# Patient Record
Sex: Male | Born: 1975 | Race: Black or African American | Hispanic: No | Marital: Married | Smoking: Never smoker
Health system: Southern US, Community
[De-identification: ages and names within clinical notes are randomized; demographics above are authoritative.]

## PROBLEM LIST (undated history)

## (undated) DIAGNOSIS — J45909 Unspecified asthma, uncomplicated: Secondary | ICD-10-CM

## (undated) DIAGNOSIS — G43909 Migraine, unspecified, not intractable, without status migrainosus: Secondary | ICD-10-CM

## (undated) DIAGNOSIS — K219 Gastro-esophageal reflux disease without esophagitis: Secondary | ICD-10-CM

## (undated) HISTORY — PX: OTHER SURGICAL HISTORY: SHX169

## (undated) HISTORY — PX: ANTERIOR CRUCIATE LIGAMENT REPAIR: SHX115

---

## 2018-05-01 ENCOUNTER — Emergency Department (HOSPITAL_COMMUNITY)
Admission: EM | Admit: 2018-05-01 | Discharge: 2018-05-01 | Disposition: A | Discharge status: Home / Self Care | Payer: BC Managed Care – PPO | Attending: Emergency Medicine | Admitting: Emergency Medicine

## 2018-05-01 ENCOUNTER — Encounter (HOSPITAL_COMMUNITY): Payer: Self-pay

## 2018-05-01 ENCOUNTER — Emergency Department (HOSPITAL_COMMUNITY): Payer: BC Managed Care – PPO

## 2018-05-01 ENCOUNTER — Other Ambulatory Visit: Payer: Self-pay

## 2018-05-01 DIAGNOSIS — G43009 Migraine without aura, not intractable, without status migrainosus: Secondary | ICD-10-CM | POA: Insufficient documentation

## 2018-05-01 DIAGNOSIS — J45909 Unspecified asthma, uncomplicated: Secondary | ICD-10-CM | POA: Diagnosis not present

## 2018-05-01 DIAGNOSIS — R51 Headache: Secondary | ICD-10-CM | POA: Diagnosis present

## 2018-05-01 HISTORY — DX: Unspecified asthma, uncomplicated: J45.909

## 2018-05-01 HISTORY — DX: Gastro-esophageal reflux disease without esophagitis: K21.9

## 2018-05-01 HISTORY — DX: Migraine, unspecified, not intractable, without status migrainosus: G43.909

## 2018-05-01 LAB — HEPATIC FUNCTION PANEL
ALT: 29 U/L (ref 0–44)
AST: 22 U/L (ref 15–41)
Albumin: 3.6 g/dL (ref 3.5–5.0)
Alkaline Phosphatase: 67 U/L (ref 38–126)
Bilirubin, Direct: 0.2 mg/dL (ref 0.0–0.2)
Indirect Bilirubin: 0.4 mg/dL (ref 0.3–0.9)
TOTAL PROTEIN: 7 g/dL (ref 6.5–8.1)
Total Bilirubin: 0.6 mg/dL (ref 0.3–1.2)

## 2018-05-01 LAB — CBC
HEMATOCRIT: 39.4 % (ref 39.0–52.0)
HEMOGLOBIN: 12.7 g/dL — AB (ref 13.0–17.0)
MCH: 26.2 pg (ref 26.0–34.0)
MCHC: 32.2 g/dL (ref 30.0–36.0)
MCV: 81.4 fL (ref 78.0–100.0)
Platelets: 330 10*3/uL (ref 150–400)
RBC: 4.84 MIL/uL (ref 4.22–5.81)
RDW: 12.9 % (ref 11.5–15.5)
WBC: 4.3 10*3/uL (ref 4.0–10.5)

## 2018-05-01 LAB — URINALYSIS, ROUTINE W REFLEX MICROSCOPIC
Bilirubin Urine: NEGATIVE
GLUCOSE, UA: NEGATIVE mg/dL
HGB URINE DIPSTICK: NEGATIVE
KETONES UR: NEGATIVE mg/dL
Leukocytes, UA: NEGATIVE
Nitrite: NEGATIVE
PH: 6 (ref 5.0–8.0)
Protein, ur: NEGATIVE mg/dL
Specific Gravity, Urine: 1.009 (ref 1.005–1.030)

## 2018-05-01 LAB — DIFFERENTIAL
Abs Immature Granulocytes: 0 10*3/uL (ref 0.0–0.1)
Basophils Absolute: 0.1 10*3/uL (ref 0.0–0.1)
Basophils Relative: 2 %
Eosinophils Absolute: 0.3 10*3/uL (ref 0.0–0.7)
Eosinophils Relative: 7 %
Immature Granulocytes: 1 %
LYMPHS PCT: 28 %
Lymphs Abs: 1.2 10*3/uL (ref 0.7–4.0)
Monocytes Absolute: 0.3 10*3/uL (ref 0.1–1.0)
Monocytes Relative: 7 %
NEUTROS PCT: 55 %
Neutro Abs: 2.3 10*3/uL (ref 1.7–7.7)

## 2018-05-01 LAB — RAPID URINE DRUG SCREEN, HOSP PERFORMED
Amphetamines: NOT DETECTED
BENZODIAZEPINES: NOT DETECTED
COCAINE: NOT DETECTED
OPIATES: NOT DETECTED
TETRAHYDROCANNABINOL: NOT DETECTED

## 2018-05-01 LAB — APTT: APTT: 27 s (ref 24–36)

## 2018-05-01 LAB — I-STAT CHEM 8, ED
BUN: 9 mg/dL (ref 6–20)
CHLORIDE: 102 mmol/L (ref 98–111)
Calcium, Ion: 1.18 mmol/L (ref 1.15–1.40)
Creatinine, Ser: 1.1 mg/dL (ref 0.61–1.24)
Glucose, Bld: 103 mg/dL — ABNORMAL HIGH (ref 70–99)
HCT: 37 % — ABNORMAL LOW (ref 39.0–52.0)
Hemoglobin: 12.6 g/dL — ABNORMAL LOW (ref 13.0–17.0)
POTASSIUM: 3.9 mmol/L (ref 3.5–5.1)
SODIUM: 140 mmol/L (ref 135–145)
TCO2: 26 mmol/L (ref 22–32)

## 2018-05-01 LAB — BASIC METABOLIC PANEL
Anion gap: 7 (ref 5–15)
BUN: 10 mg/dL (ref 6–20)
CO2: 25 mmol/L (ref 22–32)
Calcium: 8.9 mg/dL (ref 8.9–10.3)
Chloride: 106 mmol/L (ref 98–111)
Creatinine, Ser: 1.12 mg/dL (ref 0.61–1.24)
GFR calc Af Amer: >60 mL/min (ref >60)
GLUCOSE: 103 mg/dL — AB (ref 70–99)
POTASSIUM: 3.7 mmol/L (ref 3.5–5.1)
SODIUM: 138 mmol/L (ref 135–145)

## 2018-05-01 LAB — ETHANOL

## 2018-05-01 LAB — I-STAT TROPONIN, ED: Troponin i, poc: 0 ng/mL (ref 0.00–0.08)

## 2018-05-01 LAB — D-DIMER, QUANTITATIVE (NOT AT ARMC)

## 2018-05-01 LAB — PROTIME-INR
INR: 1.02
PROTHROMBIN TIME: 13.3 s (ref 11.4–15.2)

## 2018-05-01 MED ORDER — DIPHENHYDRAMINE HCL 50 MG/ML IJ SOLN
12.5000 mg | Freq: Once | INTRAMUSCULAR | Status: AC
Start: 2018-05-01 — End: 2018-05-01
  Administered 2018-05-01: 12.5 mg via INTRAVENOUS
  Filled 2018-05-01: qty 1

## 2018-05-01 MED ORDER — SODIUM CHLORIDE 0.9 % IV BOLUS
1000.0000 mL | Freq: Once | INTRAVENOUS | Status: AC
  Administered 2018-05-01: 1000 mL via INTRAVENOUS

## 2018-05-01 MED ORDER — ACETAMINOPHEN 500 MG PO TABS
1000.0000 mg | ORAL_TABLET | Freq: Once | ORAL | Status: AC
  Administered 2018-05-01: 1000 mg via ORAL
  Filled 2018-05-01: qty 2

## 2018-05-01 MED ORDER — KETOROLAC TROMETHAMINE 30 MG/ML IJ SOLN
15.0000 mg | Freq: Once | INTRAMUSCULAR | Status: AC
  Administered 2018-05-01: 15 mg via INTRAVENOUS
  Filled 2018-05-01: qty 1

## 2018-05-01 MED ORDER — METOCLOPRAMIDE HCL 5 MG/ML IJ SOLN
10.0000 mg | Freq: Once | INTRAMUSCULAR | Status: AC
  Administered 2018-05-01: 10 mg via INTRAVENOUS
  Filled 2018-05-01: qty 2

## 2018-05-01 NOTE — ED Triage Notes (Signed)
Pt since 8pm last night has not been feeling good. Pt has been having a headache since then. Last night while driving home he had became confused that lasted for 15-20 seconds, also became  Nauseous and acid reflux began to flare up. Pt states he feels better but still has a headache. Pt also states that he is having neck pain that radiates to the chest. 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 Pt axox4 at this time.

## 2018-05-01 NOTE — ED Notes (Signed)
Walked patient to the bathroom patient did well 

## 2018-05-01 NOTE — Discharge Instructions (Addendum)
Please schedule an appointment with your neurologist to discuss your ED visit, MRI and symptoms today.  Please follow-up with your primary care doctor to evaluate what we call modifiable cardiovascular risk factors such as lifestyle, cholesterol, and others.  If your symptoms recur, or you have any new symptoms or concerns please seek additional medical care and evaluation.  As we discussed Your testing today came back reassuring and it is reassuring that your symptoms resolved with treatment.

## 2018-05-01 NOTE — ED Notes (Signed)
Care assumed at this time; resting quietly on stretcher with eyes closed; easily arousable; reports left-sdied h/a 7/7; meds ordered per MD; family at bedside asleep in recliner

## 2018-05-01 NOTE — ED Notes (Signed)
Transported to MRI via stretcher per rad tech  

## 2018-05-01 NOTE — ED Provider Notes (Signed)
[facility] Provider Note   CSN: [id number] Arrival date & time: 05/01/18  0532     History   Chief Complaint Chief Complaint  Patient presents with  . Transient Ischemic Attack  . Migraine  . Chest Pain    HPI Luke Willis is a 42 y.o. male with a past medical history of complex migraines, not currently taking preventive medications,  who presents today for evaluation of multiple symptoms.  He reports that since about 8 PM last night he has been in early not feeling well. He reports that he and his daughter are visiting from [place].  He reports that he was driving and started not feeling well.  He reports feeling generally weak.  He reports that he tried drinking a bunch of water which did not help.  He has a history of left sided symptoms with complex migraines.  He reports that his left sided head hurts.  He denies any trauma.    He reports that he had 2 substances started, one last night, and one early this morning where he got to an intersection and became very confused.  He was unable to figure out where he needs to go.  His daughter reports that his speech was not slurred and he was otherwise acting normal during this.  He had asked his daughter to list ingredients on the Gatorade bottle to try and distract him in case it was anxiety and he fully remembers this interaction.    He denies any medications.  No history of high blood pressure blood clots.  HPI  Past Medical History:  Diagnosis Date  . Asthma    sports induce  . GERD (gastroesophageal reflux disease)   . Migraines     There are no active problems to display for this patient.   Past Surgical History:  Procedure Laterality Date  . ANTERIOR CRUCIATE LIGAMENT REPAIR    . Left rotator cuff          Home Medications    Prior to Admission medications   Not on File    Family History History reviewed. No pertinent family history.  Social History Social  History   Tobacco Use  . Smoking status: Never Smoker  . Smokeless tobacco: Never Used  Substance Use Topics  . Alcohol use: Never    Frequency: Never  . Drug use: Never     Allergies   Hydrocodone-acetaminophen and Sertraline   Review of Systems Review of Systems  Constitutional: Negative for chills and fever.  HENT: Negative for ear pain and sore throat.   Eyes: Positive for visual disturbance (Blurry vision). Negative for pain.  Respiratory: Positive for chest tightness. Negative for cough and shortness of breath.   Cardiovascular: Positive for chest pain. Negative for palpitations and leg swelling.  Gastrointestinal: Positive for nausea. Negative for abdominal pain and vomiting.  Genitourinary: Negative for dysuria and hematuria.  Musculoskeletal: Negative for arthralgias and back pain.  Skin: Negative for color change and rash.  Neurological: Positive for weakness and headaches. Negative for seizures and syncope.  Psychiatric/Behavioral: Positive for confusion. The patient is not nervous/anxious.   All other systems reviewed and are negative.    Physical Exam Updated Vital Signs BP (!) 163/108   Pulse 63   Temp 97.6 F (36.4 C) (Oral)   Resp 20   Ht 6\' 5"  (1.956 m)   Wt (!) 148.8 kg (328 lb)   SpO2 100%   BMI 38.90 kg/m   Physical  Exam  Constitutional: He appears well-developed and well-nourished.  Non-toxic appearance. No distress.  HENT:  Head: Normocephalic and atraumatic.  Eyes: Pupils are equal, round, and reactive to light. Conjunctivae and EOM are normal.  Neck: Neck supple.  Cardiovascular: Normal rate, regular rhythm, intact distal pulses and normal pulses.  No murmur heard. Pulses:      Dorsalis pedis pulses are 2+ on the right side, and 2+ on the left side.       Posterior tibial pulses are 2+ on the right side, and 2+ on the left side.  Pulmonary/Chest: Effort normal and breath sounds normal. No respiratory distress. He has no decreased breath  sounds. He has no wheezes. He has no rhonchi. He has no rales.  Abdominal: Soft. There is no tenderness.  Musculoskeletal: He exhibits no edema.       Right lower leg: Normal. He exhibits no tenderness and no edema.       Left lower leg: Normal. He exhibits no tenderness and no edema.  Neurological: He is alert.  Mental Status:  Alert, oriented, thought content appropriate, able to give a coherent history. Speech fluent without evidence of aphasia. Able to follow 2 step commands without difficulty.  Cranial Nerves:  II:  Peripheral visual fields grossly normal, pupils equal, round, reactive to light III,IV, VI: ptosis not present, extra-ocular motions intact bilaterally  V,VII: smile symmetric, facial light touch sensation equal VIII: hearing grossly normal to voice  X: uvula elevates symmetrically  XI: bilateral shoulder shrug symmetric and strong XII: midline tongue extension without fassiculations Motor:  Normal tone. 5/5 in upper and lower extremities on the right side. Left side leg is able to be pushed down to bed, 4+/5 strength, left side arm is able to be pushed down 4+/5.  Remainder of left sided neuro exam with 5/5 strength.   Cerebellar: normal finger-to-nose with bilateral upper extremities CV: distal pulses palpable throughout    Skin: Skin is warm and dry.  Psychiatric: He has a normal mood and affect.  Nursing note and vitals reviewed.    ED Treatments / Results  Labs (all labs ordered are listed, but only abnormal results are displayed) Labs Reviewed  BASIC METABOLIC PANEL - Abnormal; Notable for the following components:      Result Value   Glucose, Bld 103 (*)    All other components within normal limits  CBC - Abnormal; Notable for the following components:   Hemoglobin 12.7 (*)    All other components within normal limits  RAPID URINE DRUG SCREEN, HOSP PERFORMED - Abnormal; Notable for the following components:   Barbiturates   (*)    Value: Result not  available. Reagent lot number recalled by manufacturer.   All other components within normal limits  URINALYSIS, ROUTINE W REFLEX MICROSCOPIC - Abnormal; Notable for the following components:   Color, Urine STRAW (*)    All other components within normal limits  I-STAT CHEM 8, ED - Abnormal; Notable for the following components:   Glucose, Bld 103 (*)    Hemoglobin 12.6 (*)    HCT 37.0 (*)    All other components within normal limits  ETHANOL  PROTIME-INR  APTT  DIFFERENTIAL  D-DIMER, QUANTITATIVE (NOT AT [facility])  HEPATIC FUNCTION PANEL  I-STAT TROPONIN, ED    EKG EKG Interpretation  Date/Time:  Wednesday May 01 2018 05:46:17 EDT Ventricular Rate:  62 PR Interval:    QRS Duration: 99 QT Interval:  404 QTC Calculation: 411 R Axis:  32 Text Interpretation:  Sinus rhythm Confirmed by Donnetta Hutching ([id number]) on 05/01/2018 9:29:30 AM   Radiology Dg Chest 2 View  Result Date: 05/01/2018 CLINICAL DATA:  42 y/o  M; chest pain. EXAM: CHEST - 2 VIEW COMPARISON:  None. FINDINGS: The heart size and mediastinal contours are within normal limits. Both lungs are clear. The visualized skeletal structures are unremarkable. IMPRESSION: No acute pulmonary process identified. Electronically Signed   By: Mitzi Hansen M.D.   On: 05/01/2018 06:34   Ct Head Wo Contrast  Result Date: 05/01/2018 CLINICAL DATA:  Headache with confusion and fatigue EXAM: CT HEAD WITHOUT CONTRAST TECHNIQUE: Contiguous axial images were obtained from the base of the skull through the vertex without intravenous contrast. COMPARISON:  None. FINDINGS: Brain: The ventricles are normal in size and configuration. There is no intracranial mass, hemorrhage, extra-axial fluid collection, or midline shift. Gray-white compartments appear normal. No evident acute infarct. Vascular: No hyperdense vessels. No vascular calcifications are evident. Skull: Bony calvarium appears intact. Sinuses/Orbits: There is mucosal thickening in  the maxillary antra bilaterally. There is extensive opacification in multiple ethmoid air cells. There is mucosal thickening in both anterior sphenoid sinuses and in the posterior right sphenoid sinus. Orbits appear symmetric bilaterally. Other: Visualized mastoid air cells are clear. IMPRESSION: No mass or hemorrhage.  Gray-white compartments appear normal. There is multifocal paranasal sinus disease. Electronically Signed   By: Bretta Bang III M.D.   On: 05/01/2018 07:31   Mr Brain Wo Contrast  Result Date: 05/01/2018 CLINICAL DATA:  Headache beginning yesterday. Confusion beginning last night. EXAM: MRI HEAD WITHOUT CONTRAST TECHNIQUE: Multiplanar, multiecho pulse sequences of the brain and surrounding structures were obtained without intravenous contrast. COMPARISON:  Head CT same day FINDINGS: Brain: Brain has normal appearance without evidence of malformation, atrophy, old or acute small or large vessel infarction, mass lesion, hemorrhage, hydrocephalus or extra-axial collection. Vascular: Major vessels at the base of the brain show flow. Venous sinuses appear patent. Skull and upper cervical spine: Normal. Sinuses/Orbits: There is mild mucosal thickening of the paranasal sinuses. This is often seen and may be subclinical. Orbits negative. Other: None significant. IMPRESSION: Normal examination. No abnormality seen to explain the presenting symptoms. Mild sinus mucosal thickening, often seen to this degree. Electronically Signed   By: Paulina Fusi M.D.   On: 05/01/2018 10:18    Procedures Procedures (including critical care time)  Medications Ordered in ED Medications  metoCLOPramide (REGLAN) injection 10 mg (10 mg Intravenous Given 05/01/18 0807)  acetaminophen (TYLENOL) tablet 1,000 mg (1,000 mg Oral Given 05/01/18 0741)  sodium chloride 0.9 % bolus 1,000 mL (0 mLs Intravenous Stopped 05/01/18 1228)  ketorolac (TORADOL) 30 MG/ML injection 15 mg (15 mg Intravenous Given 05/01/18 0813)    diphenhydrAMINE (BENADRYL) injection 12.5 mg (12.5 mg Intravenous Given 05/01/18 0813)     Initial Impression / Assessment and Plan / ED Course  I have reviewed the triage vital signs and the nursing notes.  Pertinent labs & imaging results that were available during my care of the patient were reviewed by me and considered in my medical decision making (see chart for details).  Clinical Course as of May 01 1526  Wed May 01, 2018  0752 Paiged Dr. Wilford Corner from neurology   [EH]  [phone] Spoke with Dr. Wilford Corner from neurology who recommended MRI.    [EH]  1130 Patient's symptoms fully resolved.  Still has lingering headache (improved) but 5/5 strength in bilateral upper and lower extremities.    [EH]  1207 Discussed with patient and wife's admission versus follow-up at home.  Patient feels like this was most likely a complicated migraine rather than a true TIA.  This is supported with a normal MRI and resolution of symptoms after migraine cocktail.  Patient requested discharge at this time.   [EH]    Clinical Course User Index [EH] Cristina Gong, PA-C   Patient presents today for evaluation of headache and complex migraine.  He has a history of complex migraines in the past.  He initially had some left-sided weakness, review of previous neurology notes from his neurologist says that his symptoms were primarily left-sided with left-sided headaches, his presentation today is consistent with this however it is slightly worse than his usual.  I discussed this patient with on-call neurology who recommended obtaining an MRI.  He was given symptomatic treatment for his migraine.  MRI and CT head was obtained without abnormalities.    UDS negative, d-dimer normal troponin is not elevated, PT/INR and ethanol levels are all normal.  Hemoglobin 12.7, however I do not feel like this is significant for his symptoms.  Patient's exam fully normalized after migraine cocktail.  I discussed with patient work-up  options along with the risks and benefits.  Specifically discussed admission for TIA type work-up versus discharge home for complex migraine that has been treated.  Given that his symptoms resolved after migraine treatment suspect that this is a migraine headache rather than a TIA.  Patient Elected to be discharged home stating that he will follow-up with his neurologist.  We discussed the risks of this and he states his understanding and still wishes to be discharged home.  Patient was discharged home.   Final Clinical Impressions(s) / ED Diagnoses   Final diagnoses:  Atypical migraine    ED Discharge Orders    None       Norman Clay 05/01/18 1527    Donnetta Hutching, MD 05/02/18 1123

## 2019-08-29 IMAGING — MR MR HEAD W/O CM
9 of 10 series · 35 of 48 positions shown · non-contrast
Comparison: Head CT same day

CLINICAL DATA: Headache beginning yesterday. Confusion beginning
last night.

EXAM:
MRI HEAD WITHOUT CONTRAST
TECHNIQUE: Multiplanar, multiecho pulse sequences of the brain and surrounding
structures were obtained without intravenous contrast.

[Series 4: DWI · axial · 3.0mm · 0.98mm/px · z∈[+25,+172]mm · 8 of 100 slices shown (1 of 2)]
[im 1/100]
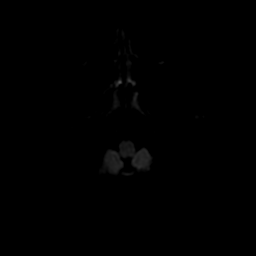
[im 12/100]
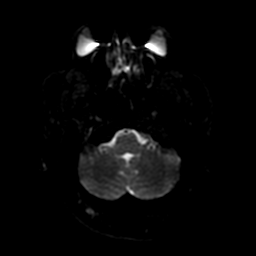
[im 34/100]
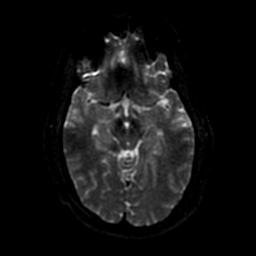
[im 45/100]
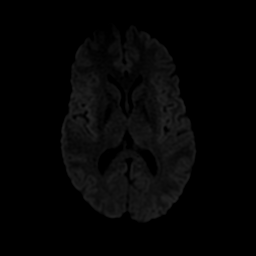
[im 56/100]
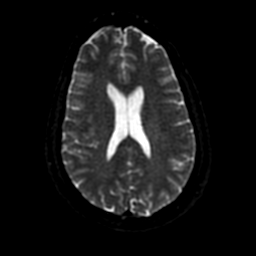
[im 67/100]
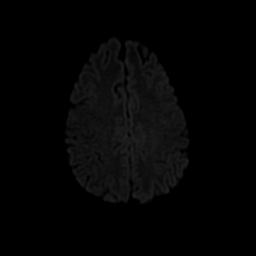
[im 89/100]
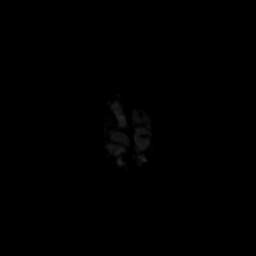
[im 100/100]
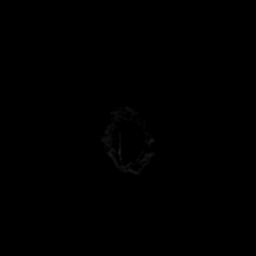

[Series 5: DWI · coronal · 4.0mm · 0.94mm/px · 7 of 72 slices shown (2 of 2)]
[im 1/72]
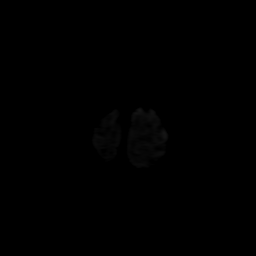
[im 12/72]
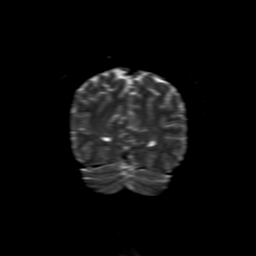
[im 24/72]
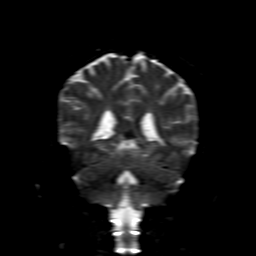
[im 36/72]
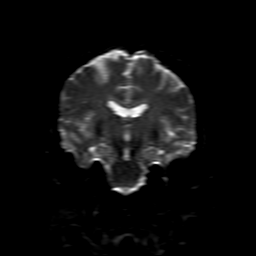
[im 48/72]
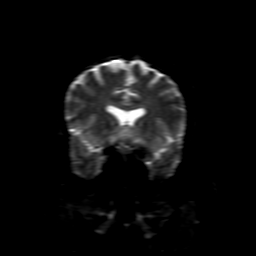
[im 60/72]
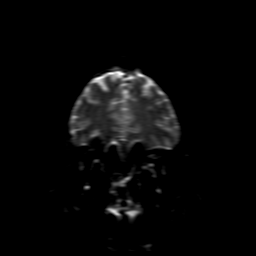
[im 72/72]
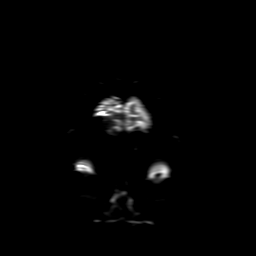

[Series 6: FLAIR · sagittal · 5.0mm · 0.49mm/px · 2 of 24 slices shown (1 of 2)]
[im 1/24]
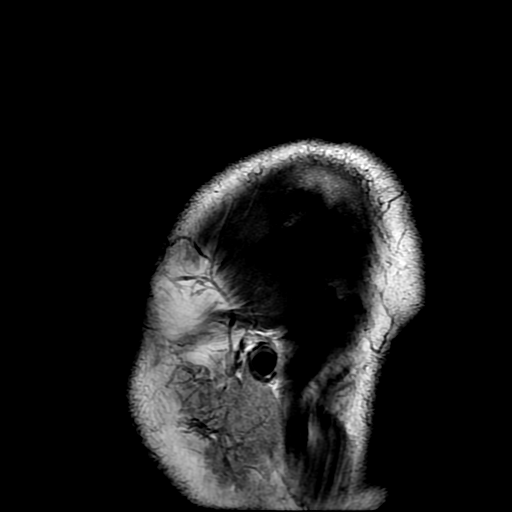
[im 24/24]
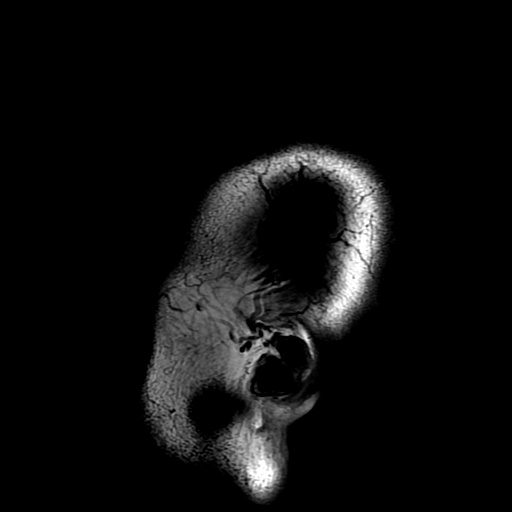

[Series 7: T2 · axial · 5.0mm · 0.49mm/px · z∈[+11,+161]mm · 2 of 26 slices shown (1 of 2)]
[im 1/26]
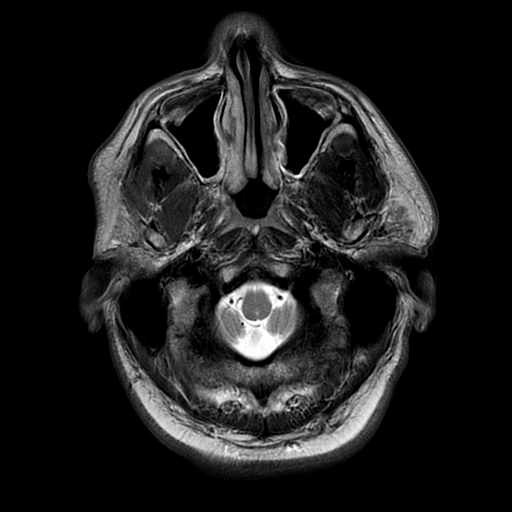
[im 26/26]
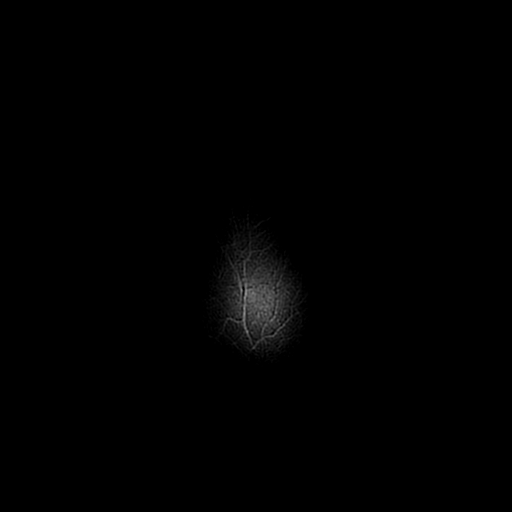

[Series 8: FLAIR · axial · 3.0mm · 0.49mm/px · z∈[+11,+161]mm · 2 of 26 slices shown (2 of 2)]
[im 1/26]
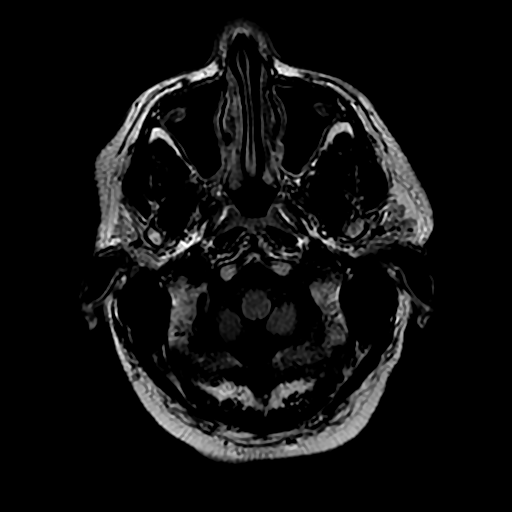
[im 26/26]
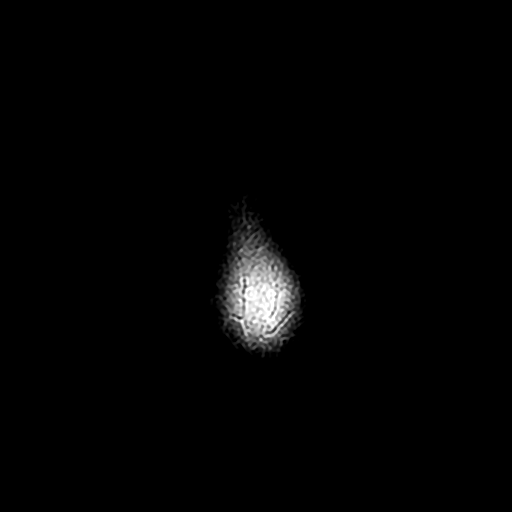

[Series 9: (person_name) · axial · 3.0mm · 0.49mm/px · z∈[+18,+54]mm · 3 of 100 slices shown]
[im 1/100]
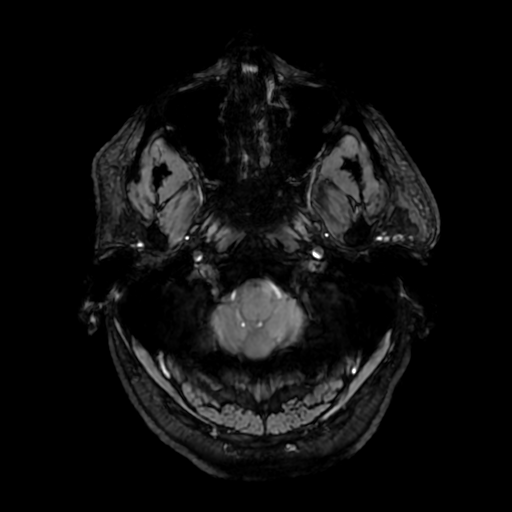
[im 13/100]
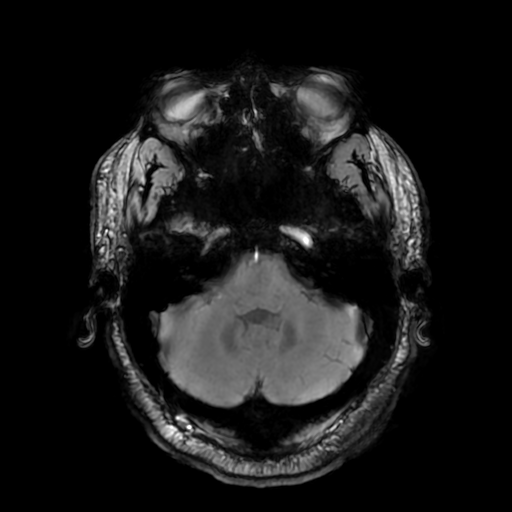
[im 25/100]
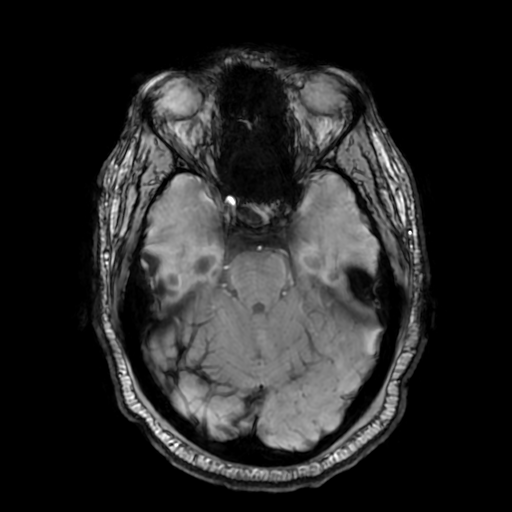

[Series 11: T2 · coronal · 5.0mm · 0.39mm/px · 3 of 31 slices shown (2 of 2)]
[im 1/31]
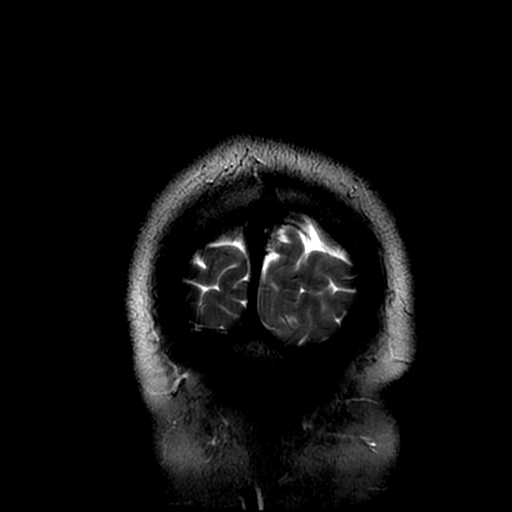
[im 16/31]
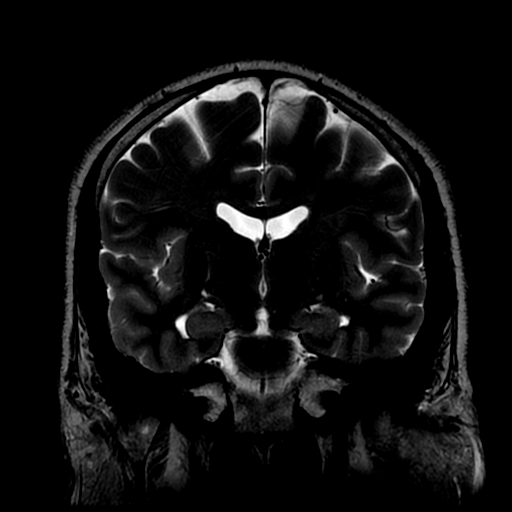
[im 31/31]
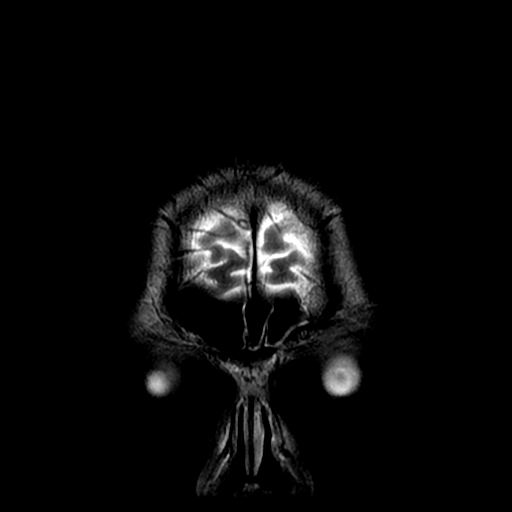

[Series 450: ADC · axial · 3.0mm · 0.98mm/px · z∈[+25,+172]mm · 5 of 50 slices shown (1 of 2)]
[im 1/50]
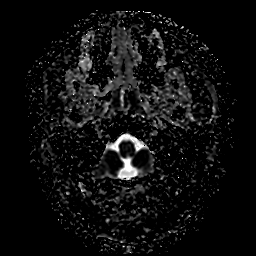
[im 13/50]
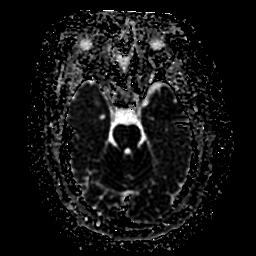
[im 25/50]
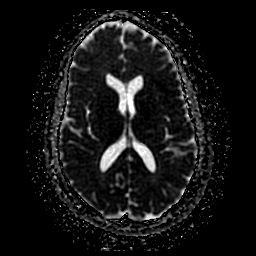
[im 37/50]
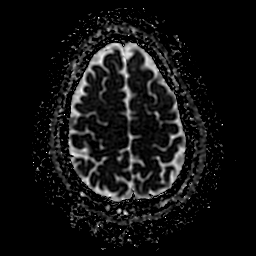
[im 50/50]
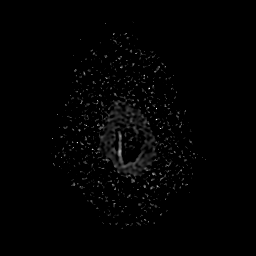

[Series 550: ADC · coronal · 4.0mm · 0.94mm/px · 3 of 36 slices shown (2 of 2)]
[im 1/36]
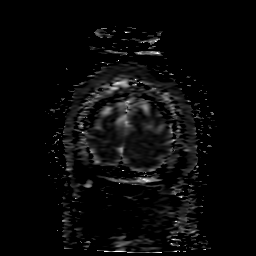
[im 18/36]
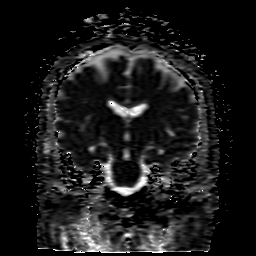
[im 36/36]
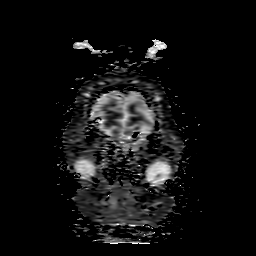

[35 of 48 positions shown; findings below may reference images not displayed]

FINDINGS: Brain: Brain has normal appearance without evidence of malformation,
atrophy, old or acute small or large vessel infarction, mass lesion,
hemorrhage, hydrocephalus or extra-axial collection.

Vascular: Major vessels at the base of the brain show flow. Venous
sinuses appear patent.

Skull and upper cervical spine: Normal.

Sinuses/Orbits: There is mild mucosal thickening of the paranasal
sinuses. This is often seen and may be subclinical. Orbits negative.

Other: None significant.
IMPRESSION: Normal examination. No abnormality seen to explain the presenting
symptoms. Mild sinus mucosal thickening, often seen to this degree.
